# Patient Record
Sex: Male | Born: 1990 | Race: White | Hispanic: No | Marital: Single | Smoking: Current every day smoker
Health system: Southern US, Community
[De-identification: ages and names within clinical notes are randomized; demographics above are authoritative.]

## PROBLEM LIST (undated history)

## (undated) DIAGNOSIS — Z720 Tobacco use: Secondary | ICD-10-CM

## (undated) DIAGNOSIS — G43909 Migraine, unspecified, not intractable, without status migrainosus: Secondary | ICD-10-CM

## (undated) DIAGNOSIS — J45909 Unspecified asthma, uncomplicated: Secondary | ICD-10-CM

## (undated) HISTORY — PX: HAND SURGERY: SHX662

## (undated) HISTORY — DX: Tobacco use: Z72.0

## (undated) HISTORY — PX: JOINT REPLACEMENT: SHX530

## (undated) HISTORY — DX: Migraine, unspecified, not intractable, without status migrainosus: G43.909

## (undated) HISTORY — DX: Unspecified asthma, uncomplicated: J45.909

---
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
## 2012-02-02 ENCOUNTER — Emergency Department: Payer: Self-pay | Admitting: Emergency Medicine

## 2012-02-02 LAB — COMPREHENSIVE METABOLIC PANEL
Albumin: 4.2 g/dL (ref 3.8–5.6)
Alkaline Phosphatase: 111 U/L (ref 98–317)
BUN: 18 mg/dL (ref 9–21)
Bilirubin,Total: 0.9 mg/dL (ref 0.2–1.0)
Chloride: 106 mmol/L (ref 97–107)
Co2: 29 mmol/L — ABNORMAL HIGH (ref 16–25)
Creatinine: 1.08 mg/dL (ref 0.60–1.30)
Glucose: 77 mg/dL (ref 65–99)
Osmolality: 280 (ref 275–301)
SGPT (ALT): 18 U/L (ref 12–78)
Sodium: 140 mmol/L (ref 132–141)
Total Protein: 7.8 g/dL (ref 6.4–8.6)

## 2012-02-02 LAB — URINALYSIS, COMPLETE
Bacteria: NONE SEEN
Bilirubin,UR: NEGATIVE
Blood: NEGATIVE
Glucose,UR: NEGATIVE mg/dL (ref 0–75)
Ketone: NEGATIVE
Leukocyte Esterase: NEGATIVE
Ph: 6 (ref 4.5–8.0)
Specific Gravity: 1.024 (ref 1.003–1.030)
Squamous Epithelial: NONE SEEN
WBC UR: 1 /HPF (ref 0–5)

## 2012-02-02 LAB — CBC
HCT: 45.8 % (ref 40.0–52.0)
HGB: 15.8 g/dL (ref 13.0–18.0)
MCH: 31.7 pg (ref 26.0–34.0)
MCHC: 34.5 g/dL (ref 32.0–36.0)
MCV: 92 fL (ref 80–100)

## 2012-02-02 LAB — LIPASE, BLOOD: Lipase: 57 U/L — ABNORMAL LOW (ref 73–393)

## 2012-05-07 ENCOUNTER — Emergency Department: Payer: Self-pay | Admitting: Emergency Medicine

## 2012-08-01 ENCOUNTER — Emergency Department: Payer: Self-pay | Admitting: Emergency Medicine

## 2012-08-01 LAB — COMPREHENSIVE METABOLIC PANEL
Albumin: 4.5 g/dL (ref 3.8–5.6)
Chloride: 104 mmol/L (ref 98–107)
Co2: 26 mmol/L (ref 21–32)
Creatinine: 1.11 mg/dL (ref 0.60–1.30)
Glucose: 99 mg/dL (ref 65–99)
Sodium: 137 mmol/L (ref 136–145)
Total Protein: 8.7 g/dL — ABNORMAL HIGH (ref 6.4–8.6)

## 2012-08-01 LAB — CBC
HCT: 54.4 % — ABNORMAL HIGH (ref 40.0–52.0)
MCHC: 33.6 g/dL (ref 32.0–36.0)
Platelet: 389 10*3/uL (ref 150–440)
RBC: 5.89 10*6/uL (ref 4.40–5.90)
RDW: 13.3 % (ref 11.5–14.5)

## 2012-08-01 LAB — URINALYSIS, COMPLETE
Bilirubin,UR: NEGATIVE
Nitrite: NEGATIVE
RBC,UR: <1 /HPF (ref 0–5)
Specific Gravity: 1.032 (ref 1.003–1.030)
WBC UR: 3 /HPF (ref 0–5)

## 2015-01-01 ENCOUNTER — Observation Stay
Admission: EM | Admit: 2015-01-01 | Discharge: 2015-01-02 | Disposition: A | Discharge status: Home / Self Care | Payer: Self-pay | Attending: Internal Medicine | Admitting: Internal Medicine

## 2015-01-01 ENCOUNTER — Emergency Department: Payer: Self-pay

## 2015-01-01 ENCOUNTER — Encounter: Payer: Self-pay | Admitting: Emergency Medicine

## 2015-01-01 DIAGNOSIS — E162 Hypoglycemia, unspecified: Secondary | ICD-10-CM | POA: Insufficient documentation

## 2015-01-01 DIAGNOSIS — R55 Syncope and collapse: Principal | ICD-10-CM | POA: Diagnosis present

## 2015-01-01 DIAGNOSIS — F17210 Nicotine dependence, cigarettes, uncomplicated: Secondary | ICD-10-CM | POA: Insufficient documentation

## 2015-01-01 LAB — COMPREHENSIVE METABOLIC PANEL
ALT: 15 U/L — ABNORMAL LOW (ref 17–63)
ANION GAP: 9 (ref 5–15)
AST: 22 U/L (ref 15–41)
Albumin: 5.1 g/dL — ABNORMAL HIGH (ref 3.5–5.0)
Alkaline Phosphatase: 89 U/L (ref 38–126)
BILIRUBIN TOTAL: 1.1 mg/dL (ref 0.3–1.2)
BUN: 16 mg/dL (ref 6–20)
CHLORIDE: 104 mmol/L (ref 101–111)
CO2: 26 mmol/L (ref 22–32)
Calcium: 9.7 mg/dL (ref 8.9–10.3)
Creatinine, Ser: 1.04 mg/dL (ref 0.61–1.24)
GFR calc Af Amer: >60 mL/min (ref >60)
GFR calc non Af Amer: >60 mL/min (ref >60)
Glucose, Bld: 90 mg/dL (ref 65–99)
POTASSIUM: 3.5 mmol/L (ref 3.5–5.1)
SODIUM: 139 mmol/L (ref 135–145)
Total Protein: 8.1 g/dL (ref 6.5–8.1)

## 2015-01-01 LAB — URINE DRUG SCREEN, QUALITATIVE (ARMC ONLY)
Amphetamines, Ur Screen: NOT DETECTED
BENZODIAZEPINE, UR SCRN: NOT DETECTED
Barbiturates, Ur Screen: NOT DETECTED
Cannabinoid 50 Ng, Ur ~~LOC~~: POSITIVE — AB
Cocaine Metabolite,Ur ~~LOC~~: NOT DETECTED
MDMA (Ecstasy)Ur Screen: NOT DETECTED
Methadone Scn, Ur: NOT DETECTED
Opiate, Ur Screen: NOT DETECTED
PHENCYCLIDINE (PCP) UR S: NOT DETECTED
TRICYCLIC, UR SCREEN: NOT DETECTED

## 2015-01-01 LAB — CBC WITH DIFFERENTIAL/PLATELET
Basophils Absolute: 0 10*3/uL (ref 0–0.1)
Basophils Relative: 0 %
Eosinophils Absolute: 0.2 10*3/uL (ref 0–0.7)
Eosinophils Relative: 2 %
HEMATOCRIT: 52.4 % — AB (ref 40.0–52.0)
HEMOGLOBIN: 17.7 g/dL (ref 13.0–18.0)
LYMPHS ABS: 2.3 10*3/uL (ref 1.0–3.6)
Lymphocytes Relative: 23 %
MCH: 31.4 pg (ref 26.0–34.0)
MCHC: 33.8 g/dL (ref 32.0–36.0)
MCV: 92.7 fL (ref 80.0–100.0)
Monocytes Absolute: 0.7 10*3/uL (ref 0.2–1.0)
Monocytes Relative: 7 %
Neutro Abs: 6.9 10*3/uL — ABNORMAL HIGH (ref 1.4–6.5)
Neutrophils Relative %: 68 %
Platelets: 285 10*3/uL (ref 150–440)
RBC: 5.66 MIL/uL (ref 4.40–5.90)
RDW: 13.6 % (ref 11.5–14.5)
WBC: 10.2 10*3/uL (ref 3.8–10.6)

## 2015-01-01 LAB — ETHANOL: Alcohol, Ethyl (B): <5 mg/dL (ref <5)

## 2015-01-01 LAB — GLUCOSE, CAPILLARY: GLUCOSE-CAPILLARY: 89 mg/dL (ref 65–99)

## 2015-01-01 LAB — TROPONIN I

## 2015-01-01 MED ORDER — ACETAMINOPHEN 650 MG RE SUPP: 650.0000 mg | Freq: Four times a day (QID) | RECTAL | Status: DC | PRN

## 2015-01-01 MED ORDER — ONDANSETRON HCL 4 MG/2ML IJ SOLN: 4.0000 mg | Freq: Four times a day (QID) | INTRAMUSCULAR | Status: DC | PRN

## 2015-01-01 MED ORDER — HEPARIN SODIUM (PORCINE) 5000 UNIT/ML IJ SOLN
5000.0000 [IU] | Freq: Three times a day (TID) | INTRAMUSCULAR | Status: DC
Start: 2015-01-01 — End: 2015-01-02

## 2015-01-01 MED ORDER — SODIUM CHLORIDE 0.9 % IV SOLN
INTRAVENOUS | Status: DC
  Administered 2015-01-01: 23:00:00 via INTRAVENOUS

## 2015-01-01 MED ORDER — TETANUS-DIPHTH-ACELL PERTUSSIS 5-2.5-18.5 LF-MCG/0.5 IM SUSP
0.5000 mL | Freq: Once | INTRAMUSCULAR | Status: AC
  Administered 2015-01-01: 0.5 mL via INTRAMUSCULAR
  Filled 2015-01-01: qty 0.5

## 2015-01-01 MED ORDER — OXYCODONE HCL 5 MG PO TABS: 5.0000 mg | ORAL_TABLET | ORAL | Status: DC | PRN

## 2015-01-01 MED ORDER — ACETAMINOPHEN 325 MG PO TABS: 650.0000 mg | ORAL_TABLET | Freq: Four times a day (QID) | ORAL | Status: DC | PRN

## 2015-01-01 MED ORDER — SODIUM CHLORIDE 0.9 % IJ SOLN
3.0000 mL | Freq: Two times a day (BID) | INTRAMUSCULAR | Status: DC
  Administered 2015-01-01 – 2015-01-02 (×2): 3 mL via INTRAVENOUS

## 2015-01-01 MED ORDER — ONDANSETRON HCL 4 MG PO TABS
4.0000 mg | ORAL_TABLET | Freq: Four times a day (QID) | ORAL | Status: DC | PRN
Start: 2015-01-01 — End: 2015-01-02

## 2015-01-01 NOTE — ED Notes (Signed)
MD Hower at bedside. 

## 2015-01-01 NOTE — ED Notes (Signed)
MD Gayle at bedside. 

## 2015-01-01 NOTE — ED Notes (Signed)
Pt to xray at this time.
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
## 2015-01-02 MED ORDER — NICOTINE 21 MG/24HR TD PT24
21.0000 mg | MEDICATED_PATCH | Freq: Every day | TRANSDERMAL | Status: DC
  Administered 2015-01-02: 21 mg via TRANSDERMAL
  Filled 2015-01-02: qty 1

## 2015-01-02 NOTE — Progress Notes (Signed)
Pt in NAD, skin warm and dry.  Denies pain or discomfort at this time.  VSS, SR per monitor.  Pt discharge instructions given to and reviewed with him.  Pt verbalized understanding.  IV and telemetry discontinued per policy and procedure.  Pt discharged home.

## 2015-01-02 NOTE — Progress Notes (Deleted)
Patient is unhappy about having the bed alarm on, but patient agrees with keeping bed alarm on. With accompanying patient to bathroom resident denied assistance 
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
## 2015-01-02 NOTE — Discharge Instructions (Signed)
Regular sleep of 5-6 hours atleast daily is advised.

## 2015-01-02 NOTE — Progress Notes (Signed)
Communicated with Dr. Sheryle Hail about getting a order for a nicotine patch. Nurse informed Dr. Sheryle Hail that resident is a current smoker and trying to quit.

## 2015-01-31 ENCOUNTER — Encounter: Payer: Self-pay | Admitting: Emergency Medicine

## 2015-01-31 ENCOUNTER — Emergency Department
Admission: EM | Admit: 2015-01-31 | Discharge: 2015-01-31 | Disposition: A | Discharge status: Home / Self Care | Payer: Self-pay | Attending: Emergency Medicine | Admitting: Emergency Medicine

## 2015-01-31 DIAGNOSIS — J039 Acute tonsillitis, unspecified: Secondary | ICD-10-CM | POA: Insufficient documentation

## 2015-01-31 DIAGNOSIS — Z720 Tobacco use: Secondary | ICD-10-CM | POA: Insufficient documentation

## 2015-01-31 LAB — CBC WITH DIFFERENTIAL/PLATELET
Basophils Absolute: 0 10*3/uL (ref 0–0.1)
Basophils Relative: 0 %
EOS ABS: 0.2 10*3/uL (ref 0–0.7)
EOS PCT: 1 %
HCT: 49.5 % (ref 40.0–52.0)
Hemoglobin: 17 g/dL (ref 13.0–18.0)
LYMPHS ABS: 1.2 10*3/uL (ref 1.0–3.6)
LYMPHS PCT: 8 %
MCH: 31.6 pg (ref 26.0–34.0)
MCHC: 34.3 g/dL (ref 32.0–36.0)
MCV: 92.4 fL (ref 80.0–100.0)
MONO ABS: 1.7 10*3/uL — AB (ref 0.2–1.0)
Monocytes Relative: 10 %
Neutro Abs: 13 10*3/uL — ABNORMAL HIGH (ref 1.4–6.5)
Neutrophils Relative %: 81 %
PLATELETS: 270 10*3/uL (ref 150–440)
RBC: 5.36 MIL/uL (ref 4.40–5.90)
RDW: 13 % (ref 11.5–14.5)
WBC: 16.2 10*3/uL — AB (ref 3.8–10.6)

## 2015-01-31 LAB — COMPREHENSIVE METABOLIC PANEL
ALT: 12 U/L — ABNORMAL LOW (ref 17–63)
ANION GAP: 8 (ref 5–15)
AST: 16 U/L (ref 15–41)
Albumin: 4.3 g/dL (ref 3.5–5.0)
Alkaline Phosphatase: 86 U/L (ref 38–126)
BILIRUBIN TOTAL: 0.6 mg/dL (ref 0.3–1.2)
BUN: 11 mg/dL (ref 6–20)
CHLORIDE: 101 mmol/L (ref 101–111)
CO2: 27 mmol/L (ref 22–32)
Calcium: 9.3 mg/dL (ref 8.9–10.3)
Creatinine, Ser: 1.08 mg/dL (ref 0.61–1.24)
Glucose, Bld: 103 mg/dL — ABNORMAL HIGH (ref 65–99)
POTASSIUM: 4.2 mmol/L (ref 3.5–5.1)
Sodium: 136 mmol/L (ref 135–145)
TOTAL PROTEIN: 8 g/dL (ref 6.5–8.1)

## 2015-01-31 LAB — POCT RAPID STREP A: STREPTOCOCCUS, GROUP A SCREEN (DIRECT): NEGATIVE

## 2015-01-31 LAB — MONONUCLEOSIS SCREEN: MONO SCREEN: NEGATIVE

## 2015-01-31 MED ORDER — SODIUM CHLORIDE 0.9 % IV BOLUS (SEPSIS)
1000.0000 mL | Freq: Once | INTRAVENOUS | Status: AC
  Administered 2015-01-31: 1000 mL via INTRAVENOUS

## 2015-01-31 MED ORDER — KETOROLAC TROMETHAMINE 30 MG/ML IJ SOLN
30.0000 mg | Freq: Once | INTRAMUSCULAR | Status: AC
  Administered 2015-01-31: 30 mg via INTRAVENOUS
  Filled 2015-01-31: qty 1

## 2015-01-31 MED ORDER — SODIUM CHLORIDE 0.9 % IV SOLN
3.0000 g | Freq: Four times a day (QID) | INTRAVENOUS | Status: DC
  Administered 2015-01-31: 3 g via INTRAVENOUS
  Filled 2015-01-31: qty 3

## 2015-01-31 MED ORDER — AMOXICILLIN-POT CLAVULANATE 875-125 MG PO TABS: 1.0000 | ORAL_TABLET | Freq: Two times a day (BID) | ORAL | Status: DC

## 2015-01-31 MED ORDER — DEXAMETHASONE SODIUM PHOSPHATE 10 MG/ML IJ SOLN
10.0000 mg | Freq: Once | INTRAMUSCULAR | Status: AC
  Administered 2015-01-31: 10 mg via INTRAVENOUS
  Filled 2015-01-31: qty 1

## 2015-01-31 MED ORDER — HYDROCODONE-ACETAMINOPHEN 5-325 MG PO TABS: 1.0000 | ORAL_TABLET | ORAL | Status: DC | PRN

## 2015-01-31 NOTE — ED Notes (Signed)
Pt. States sore throat for the past 24 hours.  Pt. States difficulty eating.   Pt. States hx of strep throat.  Pt. Denies anyone in house with same symptoms.

## 2015-01-31 NOTE — ED Notes (Signed)
Feels better   And feels less swollen .able to swallow well.Marland Kitchen

## 2015-01-31 NOTE — Discharge Instructions (Signed)
Follow-up with the ENT doctor early next week. Return to the emergency department for symptoms that change or worsen if she unable schedule an appointment. Take the antibiotic until finished even if you feel better.

## 2015-01-31 NOTE — ED Provider Notes (Signed)
[facility] Emergency Department Provider Note  ____________________________________________  Time seen: Approximately 7:22 AM  I have reviewed the triage vital signs and the nursing notes.   HISTORY  Chief Complaint Sore Throat   HPI Jeff Welch is a 24 y.o. male presents to the emergency department for evaluation of sore throat. Sudden onset 2 days ago. Pain is only on the right side. Painful to swallow. No known illness exposure. No fever.   History reviewed. No pertinent past medical history.  Patient Active Problem List   Diagnosis Date Noted  . Syncope 01/01/2015    Past Surgical History  Procedure Laterality Date  . Joint replacement    . Hand surgery      Current Outpatient Rx  Name  Route  Sig  Dispense  Refill  . amoxicillin-clavulanate (AUGMENTIN) 875-125 MG per tablet   Oral   Take 1 tablet by mouth 2 (two) times daily.   20 tablet   0   . HYDROcodone-acetaminophen (NORCO/VICODIN) 5-325 MG per tablet   Oral   Take 1 tablet by mouth every 4 (four) hours as needed.   12 tablet   0     Allergies Review of patient's allergies indicates no known allergies.  Family History  Problem Relation Age of Onset  . Heart failure Neg Hx     Social History Social History  Substance Use Topics  . Smoking status: Current Every Day Smoker -- 0.50 packs/day    Types: Cigarettes  . Smokeless tobacco: None  . Alcohol Use: 4.8 oz/week    8 Cans of beer per week    Review of Systems Constitutional:Feverno Eyes: No visual changes. ENT: Sore throat--yes, Difficulty Swallowing--painful, but possible Respiratory: Denies shortness of breath. Gastrointestinal: No abdominal pain.  No nausea, no vomiting.  No diarrhea.  Genitourinary: Negative for dysuria. Musculoskeletal:Generalized body aches: no Skin: Rash: no  Neurological: Negative for headaches, focal weakness or numbness.  10-point ROS otherwise  negative.  ____________________________________________   PHYSICAL EXAM:  VITAL SIGNS: ED Triage Vitals  Enc Vitals Group     BP 01/31/15 0655 121/71 mmHg     Pulse Rate 01/31/15 0655 90     Resp 01/31/15 0655 16     Temp 01/31/15 0655 97.7 F (36.5 C)     Temp Source 01/31/15 0655 Oral     SpO2 01/31/15 0655 98 %     Weight 01/31/15 0655 145 lb (65.772 kg)     Height 01/31/15 0655 6' (1.829 m)     Head Cir --      Peak Flow --      Pain Score 01/31/15 0656 9     Pain Loc --      Pain Edu? --      Excl. in GC? --     Constitutional: Alert and oriented. Well appearing and in no acute distress. Eyes: Conjunctivae are normal. PERRL. EOMI. Head: Atraumatic. Nose: No congestion/rhinnorhea. Mouth/Throat: Mucous membranes are moist. Right tonsil swelling with exudate that touches the uvula, however the uvula is not shifted. Neck: No stridor. Voice is normal. Lymphatic: Lymphadenopathy: no Cardiovascular: Normal rate, regular rhythm. Good peripheral circulation. Respiratory: Normal respiratory effort. Lungs CTAB. Gastrointestinal: Soft and nontender. Musculoskeletal: No lower extremity tenderness nor edema.   Neurologic:  Normal speech and language. No gross focal neurologic deficits are appreciated. Speech is normal. No gait instability. Skin:  Skin is warm, dry and intact. No rash noted Psychiatric: Mood and affect are normal. Speech and behavior  are normal.  ____________________________________________   LABS (all labs ordered are listed, but only abnormal results are displayed)  Labs Reviewed  CBC WITH DIFFERENTIAL/PLATELET - Abnormal; Notable for the following:    WBC 16.2 (*)    Neutro Abs 13.0 (*)    Monocytes Absolute 1.7 (*)    All other components within normal limits  COMPREHENSIVE METABOLIC PANEL - Abnormal; Notable for the following:    Glucose, Bld 103 (*)    ALT 12 (*)    All other components within normal limits  CULTURE, GROUP A STREP ([facility] ONLY)   MONONUCLEOSIS SCREEN  POCT RAPID STREP A   ____________________________________________  EKG   ____________________________________________  RADIOLOGY  Not indicated ____________________________________________   PROCEDURES  Procedure(s) performed: None  Critical Care performed: No  ____________________________________________   INITIAL IMPRESSION / ASSESSMENT AND PLAN / ED COURSE  Pertinent labs & imaging results that were available during my care of the patient were reviewed by me and considered in my medical decision making (see chart for details).  Patient was given IV Decadron, Unasyn 3 g, and 1 L of normal saline while in the emergency department. He reports that he feels much better. He was advised to take the Augmentin until finished. He was advised to follow-up with the ENT doctor. He was advised to return to the emergency department for symptoms that change or worsen if unable to schedule an appointment. ____________________________________________   FINAL CLINICAL IMPRESSION(S) / ED DIAGNOSES  Final diagnoses:  Tonsillitis      Chinita Pester, FNP 01/31/15 1610  Emily Filbert, MD 01/31/15 [phone]

## 2015-02-03 LAB — CULTURE, GROUP A STREP (THRC)

## 2015-04-17 ENCOUNTER — Emergency Department: Payer: Self-pay

## 2015-04-17 ENCOUNTER — Emergency Department
Admission: EM | Admit: 2015-04-17 | Discharge: 2015-04-17 | Disposition: A | Discharge status: Home / Self Care | Payer: Self-pay | Attending: Emergency Medicine | Admitting: Emergency Medicine

## 2015-04-17 ENCOUNTER — Encounter: Payer: Self-pay | Admitting: *Deleted

## 2015-04-17 DIAGNOSIS — Y92009 Unspecified place in unspecified non-institutional (private) residence as the place of occurrence of the external cause: Secondary | ICD-10-CM | POA: Insufficient documentation

## 2015-04-17 DIAGNOSIS — S0990XA Unspecified injury of head, initial encounter: Secondary | ICD-10-CM

## 2015-04-17 DIAGNOSIS — S1083XA Contusion of other specified part of neck, initial encounter: Secondary | ICD-10-CM | POA: Insufficient documentation

## 2015-04-17 DIAGNOSIS — Y998 Other external cause status: Secondary | ICD-10-CM | POA: Insufficient documentation

## 2015-04-17 DIAGNOSIS — S1081XA Abrasion of other specified part of neck, initial encounter: Secondary | ICD-10-CM | POA: Insufficient documentation

## 2015-04-17 DIAGNOSIS — Y9389 Activity, other specified: Secondary | ICD-10-CM | POA: Insufficient documentation

## 2015-04-17 DIAGNOSIS — Z792 Long term (current) use of antibiotics: Secondary | ICD-10-CM | POA: Insufficient documentation

## 2015-04-17 DIAGNOSIS — S0081XA Abrasion of other part of head, initial encounter: Secondary | ICD-10-CM | POA: Insufficient documentation

## 2015-04-17 DIAGNOSIS — Z720 Tobacco use: Secondary | ICD-10-CM | POA: Insufficient documentation

## 2015-04-17 MED ORDER — TRAMADOL HCL 50 MG PO TABS: 50.0000 mg | ORAL_TABLET | Freq: Four times a day (QID) | ORAL | Status: AC | PRN

## 2015-04-17 MED ORDER — ONDANSETRON HCL 4 MG PO TABS: 4.0000 mg | ORAL_TABLET | Freq: Three times a day (TID) | ORAL | Status: DC | PRN

## 2015-04-17 MED ORDER — ACETAMINOPHEN 325 MG PO TABS
650.0000 mg | ORAL_TABLET | Freq: Once | ORAL | Status: AC
  Administered 2015-04-17: 650 mg via ORAL
  Filled 2015-04-17: qty 2

## 2015-04-17 NOTE — ED Notes (Signed)
Discussed discharge instructions, prescriptions, and follow-up care with patient. No questions or concerns at this time. Pt stable at discharge.  

## 2015-04-17 NOTE — Discharge Instructions (Signed)
Concussion, Adult  A concussion, or closed-head injury, is a brain injury caused by a direct blow to the head or by a quick and sudden movement (jolt) of the head or neck. Concussions are usually not life-threatening. Even so, the effects of a concussion can be serious. If you have had a concussion before, you are more likely to experience concussion-like symptoms after a direct blow to the head.   CAUSES  · Direct blow to the head, such as from running into another player during a soccer game, being hit in a fight, or hitting your head on a hard surface.  · A jolt of the head or neck that causes the brain to move back and forth inside the skull, such as in a car crash.  SIGNS AND SYMPTOMS  The signs of a concussion can be hard to notice. Early on, they may be missed by you, family members, and health care providers. You may look fine but act or feel differently.  Symptoms are usually temporary, but they may last for days, weeks, or even longer. Some symptoms may appear right away while others may not show up for hours or days. Every head injury is different. Symptoms include:  · Mild to moderate headaches that will not go away.  · A feeling of pressure inside your head.  · Having more trouble than usual:    Learning or remembering things you have heard.    Answering questions.    Paying attention or concentrating.    Organizing daily tasks.    Making decisions and solving problems.  · Slowness in thinking, acting or reacting, speaking, or reading.  · Getting lost or being easily confused.  · Feeling tired all the time or lacking energy (fatigued).  · Feeling drowsy.  · Sleep disturbances.    Sleeping more than usual.    Sleeping less than usual.    Trouble falling asleep.    Trouble sleeping (insomnia).  · Loss of balance or feeling lightheaded or dizzy.  · Nausea or vomiting.  · Numbness or tingling.  · Increased sensitivity to:    Sounds.    Lights.    Distractions.  · Vision problems or eyes that tire  easily.  · Diminished sense of taste or smell.  · Ringing in the ears.  · Mood changes such as feeling sad or anxious.  · Becoming easily irritated or angry for little or no reason.  · Lack of motivation.  · Seeing or hearing things other people do not see or hear (hallucinations).  DIAGNOSIS  Your health care provider can usually diagnose a concussion based on a description of your injury and symptoms. He or she will ask whether you passed out (lost consciousness) and whether you are having trouble remembering events that happened right before and during your injury.  Your evaluation might include:  · A brain scan to look for signs of injury to the brain. Even if the test shows no injury, you may still have a concussion.  · Blood tests to be sure other problems are not present.  TREATMENT  · Concussions are usually treated in an emergency department, in urgent care, or at a clinic. You may need to stay in the hospital overnight for further treatment.  · Tell your health care provider if you are taking any medicines, including prescription medicines, over-the-counter medicines, and natural remedies. Some medicines, such as blood thinners (anticoagulants) and aspirin, may increase the chance of complications. Also tell your health care   provider whether you have had alcohol or are taking illegal drugs. This information may affect treatment.  · Your health care provider will send you home with important instructions to follow.  · How fast you will recover from a concussion depends on many factors. These factors include how severe your concussion is, what part of your brain was injured, your age, and how healthy you were before the concussion.  · Most people with mild injuries recover fully. Recovery can take time. In general, recovery is slower in older persons. Also, persons who have had a concussion in the past or have other medical problems may find that it takes longer to recover from their current injury.  HOME  CARE INSTRUCTIONS  General Instructions  · Carefully follow the directions your health care provider gave you.  · Only take over-the-counter or prescription medicines for pain, discomfort, or fever as directed by your health care provider.  · Take only those medicines that your health care provider has approved.  · Do not drink alcohol until your health care provider says you are well enough to do so. Alcohol and certain other drugs may slow your recovery and can put you at risk of further injury.  · If it is harder than usual to remember things, write them down.  · If you are easily distracted, try to do one thing at a time. For example, do not try to watch TV while fixing dinner.  · Talk with family members or close friends when making important decisions.  · Keep all follow-up appointments. Repeated evaluation of your symptoms is recommended for your recovery.  · Watch your symptoms and tell others to do the same. Complications sometimes occur after a concussion. Older adults with a brain injury may have a higher risk of serious complications, such as a blood clot on the brain.  · Tell your teachers, school nurse, school counselor, coach, athletic trainer, or work manager about your injury, symptoms, and restrictions. Tell them about what you can or cannot do. They should watch for:    Increased problems with attention or concentration.    Increased difficulty remembering or learning new information.    Increased time needed to complete tasks or assignments.    Increased irritability or decreased ability to cope with stress.    Increased symptoms.  · Rest. Rest helps the brain to heal. Make sure you:    Get plenty of sleep at night. Avoid staying up late at night.    Keep the same bedtime hours on weekends and weekdays.    Rest during the day. Take daytime naps or rest breaks when you feel tired.  · Limit activities that require a lot of thought or concentration. These include:    Doing homework or job-related  work.    Watching TV.    Working on the computer.  · Avoid any situation where there is potential for another head injury (football, hockey, soccer, basketball, martial arts, downhill snow sports and horseback riding). Your condition will get worse every time you experience a concussion. You should avoid these activities until you are evaluated by the appropriate follow-up health care providers.  Returning To Your Regular Activities  You will need to return to your normal activities slowly, not all at once. You must give your body and brain enough time for recovery.  · Do not return to sports or other athletic activities until your health care provider tells you it is safe to do so.  · Ask   your health care provider when you can drive, ride a bicycle, or operate heavy machinery. Your ability to react may be slower after a brain injury. Never do these activities if you are dizzy.  · Ask your health care provider about when you can return to work or school.  Preventing Another Concussion  It is very important to avoid another brain injury, especially before you have recovered. In rare cases, another injury can lead to permanent brain damage, brain swelling, or death. The risk of this is greatest during the first 7-10 days after a head injury. Avoid injuries by:  · Wearing a seat belt when riding in a car.  · Drinking alcohol only in moderation.  · Wearing a helmet when biking, skiing, skateboarding, skating, or doing similar activities.  · Avoiding activities that could lead to a second concussion, such as contact or recreational sports, until your health care provider says it is okay.  · Taking safety measures in your home.    Remove clutter and tripping hazards from floors and stairways.    Use grab bars in bathrooms and handrails by stairs.    Place non-slip mats on floors and in bathtubs.    Improve lighting in dim areas.  SEEK MEDICAL CARE IF:  · You have increased problems paying attention or  concentrating.  · You have increased difficulty remembering or learning new information.  · You need more time to complete tasks or assignments than before.  · You have increased irritability or decreased ability to cope with stress.  · You have more symptoms than before.  Seek medical care if you have any of the following symptoms for more than 2 weeks after your injury:  · Lasting (chronic) headaches.  · Dizziness or balance problems.  · Nausea.  · Vision problems.  · Increased sensitivity to noise or light.  · Depression or mood swings.  · Anxiety or irritability.  · Memory problems.  · Difficulty concentrating or paying attention.  · Sleep problems.  · Feeling tired all the time.  SEEK IMMEDIATE MEDICAL CARE IF:  · You have severe or worsening headaches. These may be a sign of a blood clot in the brain.  · You have weakness (even if only in one hand, leg, or part of the face).  · You have numbness.  · You have decreased coordination.  · You vomit repeatedly.  · You have increased sleepiness.  · One pupil is larger than the other.  · You have convulsions.  · You have slurred speech.  · You have increased confusion. This may be a sign of a blood clot in the brain.  · You have increased restlessness, agitation, or irritability.  · You are unable to recognize people or places.  · You have neck pain.  · It is difficult to wake you up.  · You have unusual behavior changes.  · You lose consciousness.  MAKE SURE YOU:  · Understand these instructions.  · Will watch your condition.  · Will get help right away if you are not doing well or get worse.     This information is not intended to replace advice given to you by your health care provider. Make sure you discuss any questions you have with your health care provider.     Document Released: 08/14/2003 Document Revised: 06/14/2014 Document Reviewed: 12/14/2012  Elsevier Interactive Patient Education ©2016 [employer] Inc[employer]
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
## 2016-07-13 ENCOUNTER — Ambulatory Visit: Payer: Self-pay | Admitting: Family Medicine

## 2016-07-15 ENCOUNTER — Ambulatory Visit (INDEPENDENT_AMBULATORY_CARE_PROVIDER_SITE_OTHER): Payer: Self-pay | Admitting: Primary Care

## 2016-07-15 ENCOUNTER — Encounter: Payer: Self-pay | Admitting: Primary Care

## 2016-07-15 VITALS — BP 122/82 | HR 89 | Temp 97.8°F | Ht 69.0 in | Wt 147.8 lb

## 2016-07-15 DIAGNOSIS — Z0000 Encounter for general adult medical examination without abnormal findings: Secondary | ICD-10-CM

## 2016-07-15 LAB — COMPREHENSIVE METABOLIC PANEL
ALT: 17 U/L (ref 0–53)
AST: 21 U/L (ref 0–37)
Albumin: 4.9 g/dL (ref 3.5–5.2)
Alkaline Phosphatase: 82 U/L (ref 39–117)
BILIRUBIN TOTAL: 0.5 mg/dL (ref 0.2–1.2)
BUN: 16 mg/dL (ref 6–23)
CO2: 33 meq/L — AB (ref 19–32)
Calcium: 10.3 mg/dL (ref 8.4–10.5)
Chloride: 102 mEq/L (ref 96–112)
Creatinine, Ser: 1.01 mg/dL (ref 0.40–1.50)
GFR: 96.92 mL/min (ref >60.00)
GLUCOSE: 65 mg/dL — AB (ref 70–99)
POTASSIUM: 4.2 meq/L (ref 3.5–5.1)
SODIUM: 140 meq/L (ref 135–145)
Total Protein: 7.9 g/dL (ref 6.0–8.3)

## 2016-07-15 NOTE — Progress Notes (Signed)
Pre visit review using our clinic review tool, if applicable. No additional management support is needed unless otherwise documented below in the visit note.  Vision Screening  Without Correction (R) 20/15  (L) 20/13

## 2016-07-15 NOTE — Patient Instructions (Addendum)
Complete lab work prior to leaving today. I will notify you of your results once received.   I will complete your form and have it ready by Friday afternoon.  It's important to improve your diet by reducing consumption of fast food, fried food, processed snack foods, sugary drinks. Increase consumption of fresh vegetables and fruits, whole grains, water.  Ensure you are drinking 64 ounces of water daily.  Start exercising. You should be getting 150 minutes of moderate intensity exercise weekly.  It was a pleasure to meet you today! Please don't hesitate to call me with any questions. Welcome to [facility]!   Food Choices for Gastroesophageal Reflux Disease, Adult When you have gastroesophageal reflux disease (GERD), the foods you eat and your eating habits are very important. Choosing the right foods can help ease your discomfort. What guidelines do I need to follow?  Choose fruits, vegetables, whole grains, and low-fat dairy products.  Choose low-fat meat, fish, and poultry.  Limit fats such as oils, salad dressings, butter, nuts, and avocado.  Keep a food diary. This helps you identify foods that cause symptoms.  Avoid foods that cause symptoms. These may be different for everyone.  Eat small meals often instead of 3 large meals a day.  Eat your meals slowly, in a place where you are relaxed.  Limit fried foods.  Cook foods using methods other than frying.  Avoid drinking alcohol.  Avoid drinking large amounts of liquids with your meals.  Avoid bending over or lying down until 2-3 hours after eating. What foods are not recommended? These are some foods and drinks that may make your symptoms worse: Vegetables  Tomatoes. Tomato juice. Tomato and spaghetti sauce. Chili peppers. Onion and garlic. Horseradish. Fruits  Oranges, grapefruit, and lemon (fruit and juice). Meats  High-fat meats, fish, and poultry. This includes hot dogs, ribs, ham, sausage, salami, and  bacon. Dairy  Whole milk and chocolate milk. Sour cream. Cream. Butter. Ice cream. Cream cheese. Drinks  Coffee and tea. Bubbly (carbonated) drinks or energy drinks. Condiments  Hot sauce. Barbecue sauce. Sweets/Desserts  Chocolate and cocoa. Donuts. Peppermint and spearmint. Fats and Oils  High-fat foods. This includes [place] fries and potato chips. Other  Vinegar. Strong spices. This includes black pepper, white pepper, red pepper, cayenne, curry powder, cloves, ginger, and chili powder. The items listed above may not be a complete list of foods and drinks to avoid. Contact your dietitian for more information.  This information is not intended to replace advice given to you by your health care provider. Make sure you discuss any questions you have with your health care provider. Document Released: 11/23/2011 Document Revised: 10/30/2015 Document Reviewed: 03/28/2013 Elsevier Interactive Patient Education  2017 [employer]
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
## 2016-07-15 NOTE — Assessment & Plan Note (Signed)
Immunizations UTD. Discussed the importance of a healthy diet and regular exercise to reduce the risk of other medical diseases. Exam today unremarkable. Labs pending. Follow up in 1 year for annual physical.

## 2016-07-16 ENCOUNTER — Telehealth: Payer: Self-pay | Admitting: Primary Care

## 2016-07-16 NOTE — Telephone Encounter (Signed)
Opened in error

## 2016-10-25 ENCOUNTER — Ambulatory Visit: Payer: Self-pay | Admitting: Family Medicine

## 2016-10-25 ENCOUNTER — Telehealth: Payer: Self-pay | Admitting: *Deleted

## 2016-10-25 DIAGNOSIS — Z0289 Encounter for other administrative examinations: Secondary | ICD-10-CM

## 2016-10-25 NOTE — Telephone Encounter (Signed)
Spoke to pt who states he was experiencing shoulder and chest pain. Pt denied any additional symptoms and refused team health, alternate office and ED. Pt denies HA or radiating pain. Pt was advised to go directly to ED if pain worsens. Not wanting to be seen in office either, but was convinced to do so as he has refused ED
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
## 2018-03-15 ENCOUNTER — Other Ambulatory Visit: Payer: Self-pay | Admitting: Primary Care

## 2018-03-15 DIAGNOSIS — Z0000 Encounter for general adult medical examination without abnormal findings: Secondary | ICD-10-CM

## 2018-03-22 ENCOUNTER — Other Ambulatory Visit: Payer: Self-pay

## 2018-03-29 ENCOUNTER — Encounter: Payer: 59 | Admitting: Primary Care

## 2018-03-29 DIAGNOSIS — Z0289 Encounter for other administrative examinations: Secondary | ICD-10-CM

## 2019-08-20 ENCOUNTER — Other Ambulatory Visit: Payer: Self-pay

## 2019-08-20 ENCOUNTER — Ambulatory Visit (INDEPENDENT_AMBULATORY_CARE_PROVIDER_SITE_OTHER): Payer: BC Managed Care – PPO | Admitting: Primary Care

## 2019-08-20 ENCOUNTER — Encounter: Payer: Self-pay | Admitting: Primary Care

## 2019-08-20 VITALS — BP 116/72 | HR 74 | Temp 96.9°F | Ht 68.75 in | Wt 171.5 lb

## 2019-08-20 DIAGNOSIS — Z720 Tobacco use: Secondary | ICD-10-CM | POA: Diagnosis not present

## 2019-08-20 MED ORDER — VARENICLINE TARTRATE 1 MG PO TABS: 1.0000 mg | ORAL_TABLET | Freq: Two times a day (BID) | ORAL | 0 refills | Status: AC

## 2019-08-20 MED ORDER — CHANTIX STARTING MONTH PAK 0.5 MG X 11 & 1 MG X 42 PO TABS: ORAL_TABLET | ORAL | 0 refills | Status: AC

## 2019-08-20 NOTE — Patient Instructions (Addendum)
  Before you start Chantix you must choose a smoking quit date. The quit date needs to be within the first two weeks of starting the pill pack.   Start with the Starting Month Pack and then advance to the Continuing Month Pack.  Please update me if you need an additional month after the continuing pack.  It was a pleasure to see you today!

## 2019-08-20 NOTE — Progress Notes (Signed)
Subjective:    Patient ID: Jeff Welch, male    DOB: 28-Sep-1990, 29 y.o.   MRN: [id number]  HPI  This visit occurred during the SARS-CoV-2 public health emergency.  Safety protocols were in place, including screening questions prior to the visit, additional usage of staff PPE, and extensive cleaning of exam room while observing appropriate contact time as indicated for disinfecting solutions.   Jeff Welch is a 29 year old male with a history of tobacco abuse who presents today to re-establish care and to discuss tobacco cessation.  He was last evaluated in February 2018 for CPE and new patient appointment.   He is currently smoking 1.5 PPD of cigarettes, also vaping throughout the week. His cousin provided him with a few of her her Chantix tablets a few months ago and noticed a reduction in cravings. He would like a prescription of his own. He denies any poor side effects including nightmares, sleep walking.   BP Readings from Last 3 Encounters:  08/20/19 116/72  07/15/16 122/82  04/17/15 110/71     Review of Systems  Respiratory: Negative for shortness of breath.        Tobacco abuse  Cardiovascular: Negative for chest pain.       Past Medical History:  Diagnosis Date  . Asthma   . Migraines   . Tobacco abuse      Social History   Socioeconomic History  . Marital status: Single    Spouse name: Not on file  . Number of children: Not on file  . Years of education: Not on file  . Highest education level: Not on file  Occupational History  . Not on file  Tobacco Use  . Smoking status: Current Every Day Smoker    Packs/day: 0.50    Types: Cigarettes  . Smokeless tobacco: Never Used  . Tobacco comment: Vaping  Substance and Sexual Activity  . Alcohol use: Yes    Alcohol/week: 2.0 standard drinks    Types: 2 Cans of beer per week  . Drug use: Yes    Types: Marijuana  . Sexual activity: Not on file  Other Topics Concern  . Not on file  Social History Narrative     Single.   Working for [employer].   Social Determinants of Health   Financial Resource Strain:   . Difficulty of Paying Living Expenses:   Food Insecurity:   . Worried About Programme researcher, broadcasting/film/video in the Last Year:   . Barista in the Last Year:   Transportation Needs:   . Freight forwarder (Medical):   Marland Kitchen Lack of Transportation (Non-Medical):   Physical Activity:   . Days of Exercise per Week:   . Minutes of Exercise per Session:   Stress:   . Feeling of Stress :   Social Connections:   . Frequency of Communication with Friends and Family:   . Frequency of Social Gatherings with Friends and Family:   . Attends Religious Services:   . Active Member of Clubs or Organizations:   . Attends Banker Meetings:   Marland Kitchen Marital Status:   Intimate Partner Violence:   . Fear of Current or Ex-Partner:   . Emotionally Abused:   Marland Kitchen Physically Abused:   . Sexually Abused:     Past Surgical History:  Procedure Laterality Date  . HAND SURGERY    . JOINT REPLACEMENT      Family History  Problem Relation Age of Onset  .  Melanoma Father   . Heart failure Neg Hx     No Known Allergies  No current outpatient medications on file prior to visit.   No current facility-administered medications on file prior to visit.    BP 116/72   Pulse 74   Temp (!) 96.9 F (36.1 C) (Temporal)   Ht 5' 8.75" (1.746 m)   Wt 171 lb 8 oz (77.8 kg)   SpO2 98%   BMI 25.51 kg/m    Objective:   Physical Exam  Constitutional: He appears well-nourished.  Cardiovascular: Normal rate and regular rhythm.  Respiratory: Effort normal and breath sounds normal.  Musculoskeletal:     Cervical back: Neck supple.  Skin: Skin is warm and dry.  Psychiatric: He has a normal mood and affect.           Assessment & Plan:

## 2019-08-20 NOTE — Assessment & Plan Note (Signed)
Smoking 1.5 PPD of cigarettes, plus vaping. Discouraged use of both.   Agree to Chantix as he noticed improvement with the few tablets that were provided by his cousin, no adverse effects.  Rx's sent for Starting and Continuing Packs. He will update if needing a 3rd month.

## 2023-01-25 ENCOUNTER — Telehealth: Payer: Self-pay | Admitting: Primary Care

## 2023-01-25 NOTE — Telephone Encounter (Signed)
Patient last seen in 2021- spoke with patient and he stated he would like to continue care with the provider but was not able to set an appointment up at the time.
